# Patient Record
Sex: Male | Born: 1950 | Race: White | Hispanic: No | Marital: Married | Smoking: Current every day smoker
Health system: Southern US, Community
[De-identification: ages and names within clinical notes are randomized; demographics above are authoritative.]

## PROBLEM LIST (undated history)

## (undated) DIAGNOSIS — M1990 Unspecified osteoarthritis, unspecified site: Secondary | ICD-10-CM

## (undated) DIAGNOSIS — R0600 Dyspnea, unspecified: Secondary | ICD-10-CM

## (undated) DIAGNOSIS — H269 Unspecified cataract: Secondary | ICD-10-CM

## (undated) DIAGNOSIS — F329 Major depressive disorder, single episode, unspecified: Secondary | ICD-10-CM

## (undated) DIAGNOSIS — F32A Depression, unspecified: Secondary | ICD-10-CM

## (undated) DIAGNOSIS — H9190 Unspecified hearing loss, unspecified ear: Secondary | ICD-10-CM

## (undated) DIAGNOSIS — K219 Gastro-esophageal reflux disease without esophagitis: Secondary | ICD-10-CM

## (undated) DIAGNOSIS — F419 Anxiety disorder, unspecified: Secondary | ICD-10-CM

## (undated) DIAGNOSIS — I10 Essential (primary) hypertension: Secondary | ICD-10-CM

## (undated) HISTORY — PX: FRACTURE SURGERY: SHX138

## (undated) HISTORY — PX: JOINT REPLACEMENT: SHX530

## (undated) HISTORY — PX: PLANTAR FASCIA RELEASE: SHX2239

## (undated) HISTORY — PX: COLONOSCOPY: SHX174

## (undated) HISTORY — PX: NASAL SEPTUM SURGERY: SHX37

## (undated) HISTORY — PX: WISDOM TOOTH EXTRACTION: SHX21

## (undated) HISTORY — PX: FOOT SURGERY: SHX648

## (undated) HISTORY — PX: TONSILLECTOMY: SUR1361

---
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
## 2011-12-11 ENCOUNTER — Ambulatory Visit: Payer: Self-pay | Admitting: General Practice

## 2011-12-11 LAB — URINALYSIS, COMPLETE
Bacteria: NONE SEEN
Bilirubin,UR: NEGATIVE
Glucose,UR: NEGATIVE mg/dL (ref 0–75)
Ketone: NEGATIVE
Leukocyte Esterase: NEGATIVE
Ph: 6 (ref 4.5–8.0)
Protein: NEGATIVE
RBC,UR: <1 /HPF (ref 0–5)
WBC UR: NONE SEEN /HPF (ref 0–5)

## 2011-12-11 LAB — BASIC METABOLIC PANEL
Anion Gap: 8 (ref 7–16)
BUN: 9 mg/dL (ref 7–18)
Chloride: 109 mmol/L — ABNORMAL HIGH (ref 98–107)
Co2: 25 mmol/L (ref 21–32)
Creatinine: 0.78 mg/dL (ref 0.60–1.30)
EGFR (African American): >60
EGFR (Non-African Amer.): >60
Glucose: 112 mg/dL — ABNORMAL HIGH (ref 65–99)

## 2011-12-11 LAB — CBC
HCT: 47.2 % (ref 40.0–52.0)
HGB: 16.3 g/dL (ref 13.0–18.0)
MCH: 33.9 pg (ref 26.0–34.0)
MCHC: 34.6 g/dL (ref 32.0–36.0)
MCV: 98 fL (ref 80–100)
RBC: 4.82 10*6/uL (ref 4.40–5.90)
RDW: 14.1 % (ref 11.5–14.5)

## 2011-12-11 LAB — APTT: Activated PTT: 37.6 secs — ABNORMAL HIGH (ref 23.6–35.9)

## 2011-12-25 ENCOUNTER — Inpatient Hospital Stay: Payer: Self-pay | Admitting: General Practice

## 2011-12-26 LAB — BASIC METABOLIC PANEL
BUN: 6 mg/dL — ABNORMAL LOW (ref 7–18)
Calcium, Total: 8 mg/dL — ABNORMAL LOW (ref 8.5–10.1)
Co2: 24 mmol/L (ref 21–32)
Creatinine: 0.64 mg/dL (ref 0.60–1.30)
EGFR (Non-African Amer.): >60
Glucose: 110 mg/dL — ABNORMAL HIGH (ref 65–99)
Potassium: 3.7 mmol/L (ref 3.5–5.1)
Sodium: 138 mmol/L (ref 136–145)

## 2011-12-26 LAB — PLATELET COUNT: Platelet: 213 10*3/uL (ref 150–440)

## 2011-12-26 LAB — HEMOGLOBIN: HGB: 13.4 g/dL (ref 13.0–18.0)

## 2011-12-27 LAB — BASIC METABOLIC PANEL
BUN: 6 mg/dL — ABNORMAL LOW (ref 7–18)
Creatinine: 0.62 mg/dL (ref 0.60–1.30)

## 2012-01-01 ENCOUNTER — Ambulatory Visit: Payer: Self-pay | Admitting: General Practice

## 2013-05-31 ENCOUNTER — Emergency Department: Payer: Self-pay | Admitting: Emergency Medicine

## 2014-06-02 NOTE — Op Note (Signed)
PATIENT NAME:  Jon Flores, TEWS MR#:  [id number] DATE OF BIRTH:  16-Apr-1950  DATE OF PROCEDURE:  12/25/2011  PREOPERATIVE DIAGNOSIS: Degenerative arthrosis of the left knee.   POSTOPERATIVE DIAGNOSIS: Degenerative arthrosis of the left knee.   PROCEDURE PERFORMED: Left total knee arthroplasty using computer-assisted navigation.   SURGEON: Illene Labrador. Hooten, M.D.   ASSISTANT: Van Clines, PA-C (required to maintain retraction throughout the procedure)   ANESTHESIA: Femoral nerve block and spinal.   ESTIMATED BLOOD LOSS: 200 mL.   FLUIDS REPLACED: 1800 mL of crystalloid.   TOURNIQUET TIME: 94 minutes.   DRAINS: Two medium drains to reinfusion system.   SOFT TISSUE RELEASES: Anterior cruciate ligament, posterior cruciate ligament, deep medial collateral ligament, patellofemoral ligament.  IMPLANTS UTILIZED: DePuy PFC Sigma size 4N (narrow) posterior stabilized femoral component (cemented), size 3 MBT tibial component (cemented), 38-mm three peg oval dome patella (cemented), and a 10-mm stabilized rotating platform polyethylene insert.   INDICATIONS FOR SURGERY: The patient is a 64 year old male who has been seen for complaints of progressive left knee pain. X-rays demonstrated severe degenerative changes with varus deformity noted. He did not see any significant improvement despite conservative nonsurgical intervention. After discussion of the risks and benefits of surgical intervention, the patient expressed his understanding of the risks and benefits and agreed with plans for surgical intervention.   PROCEDURE IN DETAIL: The patient was brought into the operating room and, after adequate femoral nerve block and spinal anesthesia was achieved, a tourniquet was placed on the patient's upper left thigh. The patient's left knee and leg were cleaned and prepped with alcohol and DuraPrep and draped in the usual sterile fashion. A "time-out" was performed as per usual protocol. The left lower extremity  was exsanguinated using an Esmarch and the tourniquet was inflated to 300 mmHg. An anterior longitudinal incision was made followed by a standard mid vastus approach. A small effusion was evacuated.  The deep fibers of the medial collateral ligament were elevated in a subperiosteal fashion off of the medial flare of the tibia so as to maintain a continuous soft tissue sleeve. The patella was subluxed laterally and the patellofemoral ligament was incised. Inspection of the knee demonstrated severe degenerative changes with eburnated bone noted to the medial compartment. Prominent osteophytes were debrided using a rongeur. Anterior and posterior cruciate ligaments were excised. Two 4-mm Schanz pins were inserted into the femur and into the tibia for attachment of the array of trackers used for computer-assisted navigation. Hip center was identified using circumduction technique. Distal landmarks were mapped using the computer. Distal femur and proximal tibia were mapped using the computer. Distal femoral cutting guide was positioned using computer-assisted navigation so as to achieve a 5-degrees distal valgus cut. Cut was performed and verified using the computer. The distal femur was sized and it was felt that a size 4n (narrow) femoral component was appropriate. A size four cutting guide was positioned and the anterior cut was performed and verified using the computer. This was followed by completion of the posterior and chamfer cuts. Femoral cutting guide for the central box was then positioned and the central box cut was performed.   Attention was then directed to the proximal tibia. Medial and lateral menisci were excised. The extramedullary tibial cutting guide was positioned using computer-assisted navigation so as to achieve 0-degree varus valgus alignment and 0-degree posterior slope. The cut was performed and verified using the computer. The proximal tibia was sized and it was felt that a size three tray  was appropriate. Tibial and femoral trials were inserted followed by insertion of a 10-mm polyethylene insert. The knee was felt to be tight and was unable to achieve full extension. Trial  components were removed and the extramedullary tibial cutting guide was positioned so as to resect an additional 2 mm of bone from the proximal tibia. The cut was performed and verified using the computer. The trial components were reinserted and the 10-mm polyethylene trial was inserted. Full extension was appreciated. Excellent mediolateral and soft tissue balancing was appreciated both in extension and in flexion. Finally, the patella was cut and prepared so as to accommodate a 38-mm three peg oval dome patella. Patellar trial was placed and the knee was placed through a range of motion with excellent patellar tracking appreciated.   The trial femoral component was removed. The central post hole for the tibial component was reamed followed by insertion of a keel punch. The tibial tray was then removed. The cut surfaces of bone were irrigated with copious amounts of normal saline with antibiotic solution using pulsatile lavage and then suctioned dry. Polymethyl methacrylate cement was prepared in the usual fashion using a vacuum mixer. Cement was applied to the cut surface of the proximal tibia as well as along the undersurface of a size three MBT tibial component. The tibial component was positioned and impacted into place. Excess cement was removed using Personal assistantreer elevators. Cement was then applied to the cut surface of the femur as well as along the posterior flanges of size 4n (narrow) posterior stabilized femoral component. The femoral component was positioned and impacted into place. Excess cement was removed using Personal assistantreer elevators. A 10-mm polyethylene trial was inserted and the knee was brought into full extension with steady axial compression applied. Finally, cement was applied to the backside of a 38-mm three peg oval  dome patella. The patellar component was positioned and patellar clamp applied. Excess cement was removed using Personal assistantreer elevators.   After adequate curing of cement, the tourniquet was deflated after a total tourniquet time of 94 minutes. Hemostasis was achieved using electrocautery. The knee was irrigated with copious amounts of normal saline with antibiotic solution using pulsatile lavage and then suctioned dry. The knee was then inspected for any residual cement debris. 30 mL of 0.25% Marcaine with epinephrine was injected along the posterior capsule. A 10-mm stabilized rotating platform polyethylene insert was inserted and the knee was placed through a range of motion. Excellent patellar tracking was appreciated. Excellent mediolateral and soft tissue balancing was noted.   Two medium drains were placed in the wound bed brought out through a separate stab incision to be attached to a reinfusion system. The medial parapatellar portion of the incision was reapproximated using interrupted sutures of #1 Vicryl. The subcutaneous tissue was approximated in layers using first #0 Vicryl followed by #2-0 Vicryl. Skin was closed with skin staples. A sterile dressing was applied.   The patient tolerated the procedure well. He was transported to the recovery room in stable condition.  ____________________________ Illene LabradorJames P. Angie FavaHooten Jr., MD [id number] D: 12/25/2011 11:12:36 ET T: 12/25/2011 11:35:57 ET JOB#: [id number]  cc: Fayrene FearingJames P. Angie FavaHooten Jr., MD, <Dictator> Illene LabradorJAMES P Angie FavaHOOTEN JR MD ELECTRONICALLY SIGNED 12/27/2011 4:43
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
## 2015-12-21 ENCOUNTER — Other Ambulatory Visit: Payer: Self-pay | Admitting: Physician Assistant

## 2015-12-21 DIAGNOSIS — M546 Pain in thoracic spine: Secondary | ICD-10-CM

## 2015-12-31 ENCOUNTER — Ambulatory Visit
Admission: RE | Admit: 2015-12-31 | Discharge: 2015-12-31 | Disposition: A | Discharge status: Home / Self Care | Payer: Medicare Other | Source: Ambulatory Visit | Attending: Physician Assistant | Admitting: Physician Assistant

## 2015-12-31 DIAGNOSIS — M546 Pain in thoracic spine: Secondary | ICD-10-CM | POA: Insufficient documentation

## 2018-04-16 ENCOUNTER — Other Ambulatory Visit: Payer: Self-pay | Admitting: Physician Assistant

## 2018-04-16 DIAGNOSIS — M79641 Pain in right hand: Secondary | ICD-10-CM

## 2018-04-16 DIAGNOSIS — M546 Pain in thoracic spine: Secondary | ICD-10-CM

## 2018-04-16 DIAGNOSIS — M47812 Spondylosis without myelopathy or radiculopathy, cervical region: Secondary | ICD-10-CM

## 2018-04-25 ENCOUNTER — Other Ambulatory Visit: Payer: Self-pay

## 2018-04-25 ENCOUNTER — Ambulatory Visit
Admission: RE | Admit: 2018-04-25 | Discharge: 2018-04-25 | Disposition: A | Discharge status: Home / Self Care | Payer: Medicare Other | Source: Ambulatory Visit | Attending: Physician Assistant | Admitting: Physician Assistant

## 2018-04-25 DIAGNOSIS — M79641 Pain in right hand: Secondary | ICD-10-CM | POA: Diagnosis not present

## 2018-04-25 DIAGNOSIS — M47812 Spondylosis without myelopathy or radiculopathy, cervical region: Secondary | ICD-10-CM

## 2018-04-25 DIAGNOSIS — M546 Pain in thoracic spine: Secondary | ICD-10-CM | POA: Diagnosis present

## 2019-04-02 ENCOUNTER — Other Ambulatory Visit: Payer: Self-pay

## 2019-04-02 ENCOUNTER — Emergency Department
Admission: EM | Admit: 2019-04-02 | Discharge: 2019-04-02 | Disposition: A | Discharge status: Home / Self Care | Payer: Medicare PPO | Attending: Emergency Medicine | Admitting: Emergency Medicine

## 2019-04-02 ENCOUNTER — Other Ambulatory Visit: Payer: Self-pay | Admitting: Orthopedic Surgery

## 2019-04-02 ENCOUNTER — Emergency Department: Payer: Medicare PPO

## 2019-04-02 DIAGNOSIS — S61215A Laceration without foreign body of left ring finger without damage to nail, initial encounter: Secondary | ICD-10-CM | POA: Diagnosis not present

## 2019-04-02 DIAGNOSIS — S62617B Displaced fracture of proximal phalanx of left little finger, initial encounter for open fracture: Secondary | ICD-10-CM | POA: Diagnosis not present

## 2019-04-02 DIAGNOSIS — Y9389 Activity, other specified: Secondary | ICD-10-CM | POA: Diagnosis not present

## 2019-04-02 DIAGNOSIS — Y999 Unspecified external cause status: Secondary | ICD-10-CM | POA: Diagnosis not present

## 2019-04-02 DIAGNOSIS — Z23 Encounter for immunization: Secondary | ICD-10-CM | POA: Insufficient documentation

## 2019-04-02 DIAGNOSIS — F17210 Nicotine dependence, cigarettes, uncomplicated: Secondary | ICD-10-CM | POA: Insufficient documentation

## 2019-04-02 DIAGNOSIS — I10 Essential (primary) hypertension: Secondary | ICD-10-CM | POA: Diagnosis not present

## 2019-04-02 DIAGNOSIS — W3400XA Accidental discharge from unspecified firearms or gun, initial encounter: Secondary | ICD-10-CM | POA: Insufficient documentation

## 2019-04-02 DIAGNOSIS — Y929 Unspecified place or not applicable: Secondary | ICD-10-CM | POA: Insufficient documentation

## 2019-04-02 HISTORY — DX: Essential (primary) hypertension: I10

## 2019-04-02 MED ORDER — TETANUS-DIPHTH-ACELL PERTUSSIS 5-2.5-18.5 LF-MCG/0.5 IM SUSP
0.5000 mL | Freq: Once | INTRAMUSCULAR | Status: AC
  Administered 2019-04-02: 0.5 mL via INTRAMUSCULAR
  Filled 2019-04-02: qty 0.5

## 2019-04-02 MED ORDER — CEPHALEXIN 500 MG PO CAPS: 500.0000 mg | ORAL_CAPSULE | Freq: Four times a day (QID) | ORAL | 0 refills | Status: AC

## 2019-04-02 MED ORDER — BUPIVACAINE HCL (PF) 0.5 % IJ SOLN
30.0000 mL | Freq: Once | INTRAMUSCULAR | Status: AC
  Administered 2019-04-02: 30 mL
  Filled 2019-04-02: qty 30

## 2019-04-02 MED ORDER — CEFAZOLIN SODIUM-DEXTROSE 1-4 GM/50ML-% IV SOLN
1.0000 g | Freq: Once | INTRAVENOUS | Status: AC
  Administered 2019-04-02: 1 g via INTRAVENOUS
  Filled 2019-04-02: qty 50

## 2019-04-02 MED ORDER — MORPHINE SULFATE (PF) 4 MG/ML IV SOLN
4.0000 mg | Freq: Once | INTRAVENOUS | Status: AC
  Administered 2019-04-02: 4 mg via INTRAVENOUS
  Filled 2019-04-02: qty 1
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
## 2019-04-02 NOTE — Discharge Instructions (Addendum)
Please follow up with the hand specialist tomorrow. Please seek medical attention for any high fevers, chest pain, shortness of breath, change in behavior, persistent vomiting, bloody stool or any other new or concerning symptoms.

## 2019-04-03 ENCOUNTER — Ambulatory Visit (HOSPITAL_COMMUNITY): Payer: Medicare PPO | Admitting: Certified Registered"

## 2019-04-03 ENCOUNTER — Other Ambulatory Visit: Payer: Self-pay

## 2019-04-03 ENCOUNTER — Encounter (HOSPITAL_COMMUNITY): Payer: Self-pay | Admitting: Orthopedic Surgery

## 2019-04-03 ENCOUNTER — Ambulatory Visit (HOSPITAL_COMMUNITY)
Admission: RE | Admit: 2019-04-03 | Discharge: 2019-04-03 | Disposition: A | Discharge status: Home / Self Care | Payer: Medicare PPO | Source: Ambulatory Visit | Attending: Orthopedic Surgery | Admitting: Orthopedic Surgery

## 2019-04-03 ENCOUNTER — Encounter (HOSPITAL_COMMUNITY)
Admission: RE | Disposition: A | Discharge status: Home / Self Care | Payer: Self-pay | Source: Ambulatory Visit | Attending: Orthopedic Surgery

## 2019-04-03 DIAGNOSIS — Z20822 Contact with and (suspected) exposure to covid-19: Secondary | ICD-10-CM | POA: Diagnosis not present

## 2019-04-03 DIAGNOSIS — F329 Major depressive disorder, single episode, unspecified: Secondary | ICD-10-CM | POA: Diagnosis not present

## 2019-04-03 DIAGNOSIS — Z6830 Body mass index (BMI) 30.0-30.9, adult: Secondary | ICD-10-CM | POA: Insufficient documentation

## 2019-04-03 DIAGNOSIS — Z79899 Other long term (current) drug therapy: Secondary | ICD-10-CM | POA: Diagnosis not present

## 2019-04-03 DIAGNOSIS — F419 Anxiety disorder, unspecified: Secondary | ICD-10-CM | POA: Diagnosis not present

## 2019-04-03 DIAGNOSIS — R0602 Shortness of breath: Secondary | ICD-10-CM | POA: Diagnosis not present

## 2019-04-03 DIAGNOSIS — E669 Obesity, unspecified: Secondary | ICD-10-CM | POA: Insufficient documentation

## 2019-04-03 DIAGNOSIS — F17210 Nicotine dependence, cigarettes, uncomplicated: Secondary | ICD-10-CM | POA: Insufficient documentation

## 2019-04-03 DIAGNOSIS — S62617B Displaced fracture of proximal phalanx of left little finger, initial encounter for open fracture: Secondary | ICD-10-CM | POA: Insufficient documentation

## 2019-04-03 DIAGNOSIS — I10 Essential (primary) hypertension: Secondary | ICD-10-CM | POA: Insufficient documentation

## 2019-04-03 DIAGNOSIS — M1990 Unspecified osteoarthritis, unspecified site: Secondary | ICD-10-CM | POA: Diagnosis not present

## 2019-04-03 DIAGNOSIS — K219 Gastro-esophageal reflux disease without esophagitis: Secondary | ICD-10-CM | POA: Diagnosis not present

## 2019-04-03 DIAGNOSIS — S61432A Puncture wound without foreign body of left hand, initial encounter: Secondary | ICD-10-CM | POA: Diagnosis present

## 2019-04-03 DIAGNOSIS — S62615B Displaced fracture of proximal phalanx of left ring finger, initial encounter for open fracture: Secondary | ICD-10-CM | POA: Diagnosis not present

## 2019-04-03 DIAGNOSIS — S66327A Laceration of extensor muscle, fascia and tendon of left little finger at wrist and hand level, initial encounter: Secondary | ICD-10-CM | POA: Insufficient documentation

## 2019-04-03 DIAGNOSIS — Y9389 Activity, other specified: Secondary | ICD-10-CM | POA: Diagnosis not present

## 2019-04-03 DIAGNOSIS — W3400XA Accidental discharge from unspecified firearms or gun, initial encounter: Secondary | ICD-10-CM | POA: Insufficient documentation

## 2019-04-03 HISTORY — DX: Unspecified hearing loss, unspecified ear: H91.90

## 2019-04-03 HISTORY — DX: Depression, unspecified: F32.A

## 2019-04-03 HISTORY — DX: Unspecified cataract: H26.9

## 2019-04-03 HISTORY — DX: Unspecified osteoarthritis, unspecified site: M19.90

## 2019-04-03 HISTORY — DX: Gastro-esophageal reflux disease without esophagitis: K21.9

## 2019-04-03 HISTORY — DX: Dyspnea, unspecified: R06.00

## 2019-04-03 HISTORY — DX: Anxiety disorder, unspecified: F41.9

## 2019-04-03 HISTORY — PX: I & D EXTREMITY: SHX5045

## 2019-04-03 LAB — CBC
HCT: 46.2 % (ref 39.0–52.0)
Hemoglobin: 15 g/dL (ref 13.0–17.0)
MCH: 32.9 pg (ref 26.0–34.0)
MCHC: 32.5 g/dL (ref 30.0–36.0)
MCV: 101.3 fL — ABNORMAL HIGH (ref 80.0–100.0)
Platelets: 287 10*3/uL (ref 150–400)
RBC: 4.56 MIL/uL (ref 4.22–5.81)
RDW: 13.2 % (ref 11.5–15.5)
WBC: 8.8 10*3/uL (ref 4.0–10.5)
nRBC: 0 % (ref 0.0–0.2)

## 2019-04-03 LAB — BASIC METABOLIC PANEL
Anion gap: 10 (ref 5–15)
BUN: 10 mg/dL (ref 8–23)
CO2: 24 mmol/L (ref 22–32)
Calcium: 9.1 mg/dL (ref 8.9–10.3)
Chloride: 102 mmol/L (ref 98–111)
Creatinine, Ser: 0.79 mg/dL (ref 0.61–1.24)
GFR calc Af Amer: >60 mL/min (ref >60)
GFR calc non Af Amer: >60 mL/min (ref >60)
Glucose, Bld: 147 mg/dL — ABNORMAL HIGH (ref 70–99)
Potassium: 4.1 mmol/L (ref 3.5–5.1)
Sodium: 136 mmol/L (ref 135–145)

## 2019-04-03 LAB — RESPIRATORY PANEL BY RT PCR (FLU A&B, COVID)
Influenza A by PCR: NEGATIVE
Influenza B by PCR: NEGATIVE
SARS Coronavirus 2 by RT PCR: NEGATIVE

## 2019-04-03 SURGERY — IRRIGATION AND DEBRIDEMENT EXTREMITY
Anesthesia: General | Site: Finger | Laterality: Left

## 2019-04-03 MED ORDER — DEXAMETHASONE SODIUM PHOSPHATE 10 MG/ML IJ SOLN
INTRAMUSCULAR | Status: DC | PRN
  Administered 2019-04-03: 5 mg via INTRAVENOUS

## 2019-04-03 MED ORDER — PROMETHAZINE HCL 25 MG/ML IJ SOLN: 6.2500 mg | INTRAMUSCULAR | Status: DC | PRN

## 2019-04-03 MED ORDER — CEFAZOLIN SODIUM-DEXTROSE 2-3 GM-%(50ML) IV SOLR
INTRAVENOUS | Status: DC | PRN
  Administered 2019-04-03: 2 g via INTRAVENOUS

## 2019-04-03 MED ORDER — SODIUM CHLORIDE 0.9 % IR SOLN
Status: DC | PRN
  Administered 2019-04-03: 1000 mL

## 2019-04-03 MED ORDER — OXYCODONE HCL 5 MG/5ML PO SOLN: 5.0000 mg | Freq: Once | ORAL | Status: AC | PRN

## 2019-04-03 MED ORDER — DEXAMETHASONE SODIUM PHOSPHATE 10 MG/ML IJ SOLN
INTRAMUSCULAR | Status: AC
  Filled 2019-04-03: qty 1

## 2019-04-03 MED ORDER — OXYCODONE HCL 5 MG PO TABS
5.0000 mg | ORAL_TABLET | Freq: Once | ORAL | Status: AC | PRN
  Administered 2019-04-03: 5 mg via ORAL

## 2019-04-03 MED ORDER — OXYCODONE-ACETAMINOPHEN 10-325 MG PO TABS: 1.0000 | ORAL_TABLET | Freq: Four times a day (QID) | ORAL | 0 refills | Status: AC | PRN

## 2019-04-03 MED ORDER — FENTANYL CITRATE (PF) 250 MCG/5ML IJ SOLN
INTRAMUSCULAR | Status: AC
  Filled 2019-04-03: qty 5

## 2019-04-03 MED ORDER — HYDROMORPHONE HCL 1 MG/ML IJ SOLN
0.2500 mg | INTRAMUSCULAR | Status: DC | PRN
  Administered 2019-04-03 (×2): 0.25 mg via INTRAVENOUS
  Administered 2019-04-03: 0.5 mg via INTRAVENOUS

## 2019-04-03 MED ORDER — FENTANYL CITRATE (PF) 250 MCG/5ML IJ SOLN
INTRAMUSCULAR | Status: DC | PRN
  Administered 2019-04-03: 50 ug via INTRAVENOUS
  Administered 2019-04-03 (×2): 100 ug via INTRAVENOUS

## 2019-04-03 MED ORDER — BUPIVACAINE HCL (PF) 0.25 % IJ SOLN
INTRAMUSCULAR | Status: AC
  Filled 2019-04-03: qty 30

## 2019-04-03 MED ORDER — PROPOFOL 10 MG/ML IV BOLUS
INTRAVENOUS | Status: DC | PRN
  Administered 2019-04-03: 170 mg via INTRAVENOUS

## 2019-04-03 MED ORDER — MEPERIDINE HCL 25 MG/ML IJ SOLN: 6.2500 mg | INTRAMUSCULAR | Status: DC | PRN

## 2019-04-03 MED ORDER — LACTATED RINGERS IV SOLN: INTRAVENOUS | Status: DC

## 2019-04-03 MED ORDER — OXYCODONE HCL 5 MG PO TABS
ORAL_TABLET | ORAL | Status: AC
  Filled 2019-04-03: qty 1

## 2019-04-03 MED ORDER — PROPOFOL 10 MG/ML IV BOLUS
INTRAVENOUS | Status: AC
  Filled 2019-04-03: qty 20

## 2019-04-03 MED ORDER — LIDOCAINE HCL (PF) 1 % IJ SOLN
INTRAMUSCULAR | Status: DC | PRN
  Administered 2019-04-03: 10 mL

## 2019-04-03 MED ORDER — CEFAZOLIN SODIUM-DEXTROSE 2-4 GM/100ML-% IV SOLN
INTRAVENOUS | Status: AC
  Filled 2019-04-03: qty 100

## 2019-04-03 MED ORDER — HYDROMORPHONE HCL 1 MG/ML IJ SOLN
INTRAMUSCULAR | Status: AC
  Filled 2019-04-03: qty 1

## 2019-04-03 MED ORDER — MIDAZOLAM HCL 2 MG/2ML IJ SOLN
INTRAMUSCULAR | Status: DC | PRN
  Administered 2019-04-03: 2 mg via INTRAVENOUS

## 2019-04-03 MED ORDER — LIDOCAINE HCL (PF) 1 % IJ SOLN
INTRAMUSCULAR | Status: AC
  Filled 2019-04-03: qty 30

## 2019-04-03 MED ORDER — ONDANSETRON HCL 4 MG/2ML IJ SOLN
INTRAMUSCULAR | Status: DC | PRN
  Administered 2019-04-03: 4 mg via INTRAVENOUS

## 2019-04-03 MED ORDER — MIDAZOLAM HCL 2 MG/2ML IJ SOLN
INTRAMUSCULAR | Status: AC
  Filled 2019-04-03: qty 2

## 2019-04-03 MED ORDER — LIDOCAINE 2% (20 MG/ML) 5 ML SYRINGE
INTRAMUSCULAR | Status: DC | PRN
  Administered 2019-04-03: 60 mg via INTRAVENOUS

## 2019-04-03 SURGICAL SUPPLY — 53 items
BNDG ELASTIC 3X5.8 VLCR STR LF (GAUZE/BANDAGES/DRESSINGS) ×3 IMPLANT
BNDG ELASTIC 4X5.8 VLCR STR LF (GAUZE/BANDAGES/DRESSINGS) ×3 IMPLANT
BNDG ESMARK 4X9 LF (GAUZE/BANDAGES/DRESSINGS) ×3 IMPLANT
BNDG GAUZE ELAST 4 BULKY (GAUZE/BANDAGES/DRESSINGS) ×3 IMPLANT
CORD BIPOLAR FORCEPS 12FT (ELECTRODE) ×3 IMPLANT
COVER SURGICAL LIGHT HANDLE (MISCELLANEOUS) ×3 IMPLANT
COVER WAND RF STERILE (DRAPES) ×3 IMPLANT
CUFF TOURN SGL QUICK 18X4 (TOURNIQUET CUFF) ×3 IMPLANT
CUFF TOURN SGL QUICK 24 (TOURNIQUET CUFF)
CUFF TRNQT CYL 24X4X16.5-23 (TOURNIQUET CUFF) IMPLANT
DRAIN PENROSE 1/4X12 LTX STRL (WOUND CARE) IMPLANT
DRAPE SURG 17X23 STRL (DRAPES) ×3 IMPLANT
DRSG ADAPTIC 3X8 NADH LF (GAUZE/BANDAGES/DRESSINGS) ×3 IMPLANT
ELECT REM PT RETURN 9FT ADLT (ELECTROSURGICAL)
ELECTRODE REM PT RTRN 9FT ADLT (ELECTROSURGICAL) IMPLANT
GAUZE SPONGE 4X4 12PLY STRL (GAUZE/BANDAGES/DRESSINGS) ×3 IMPLANT
GLOVE BIOGEL PI IND STRL 8.5 (GLOVE) ×1 IMPLANT
GLOVE BIOGEL PI INDICATOR 8.5 (GLOVE) ×2
GLOVE SURG ORTHO 8.0 STRL STRW (GLOVE) ×3 IMPLANT
GOWN STRL REUS W/ TWL LRG LVL3 (GOWN DISPOSABLE) ×3 IMPLANT
GOWN STRL REUS W/ TWL XL LVL3 (GOWN DISPOSABLE) ×1 IMPLANT
GOWN STRL REUS W/TWL LRG LVL3 (GOWN DISPOSABLE) ×6
GOWN STRL REUS W/TWL XL LVL3 (GOWN DISPOSABLE) ×2
HANDPIECE INTERPULSE COAX TIP (DISPOSABLE)
K-WIRE .045X4 (WIRE) ×3 IMPLANT
KIT BASIN OR (CUSTOM PROCEDURE TRAY) ×3 IMPLANT
KIT TURNOVER KIT B (KITS) ×3 IMPLANT
MANIFOLD NEPTUNE II (INSTRUMENTS) ×3 IMPLANT
NEEDLE HYPO 25GX1X1/2 BEV (NEEDLE) ×3 IMPLANT
NS IRRIG 1000ML POUR BTL (IV SOLUTION) ×3 IMPLANT
PACK ORTHO EXTREMITY (CUSTOM PROCEDURE TRAY) ×3 IMPLANT
PAD CAST 4YDX4 CTTN HI CHSV (CAST SUPPLIES) ×1 IMPLANT
PADDING CAST COTTON 4X4 STRL (CAST SUPPLIES) ×2
SET CYSTO W/LG BORE CLAMP LF (SET/KITS/TRAYS/PACK) IMPLANT
SET HNDPC FAN SPRY TIP SCT (DISPOSABLE) IMPLANT
SOAP 2 % CHG 4 OZ (WOUND CARE) ×3 IMPLANT
SPONGE LAP 18X18 RF (DISPOSABLE) ×3 IMPLANT
SPONGE LAP 4X18 RFD (DISPOSABLE) ×3 IMPLANT
SUT ETHIBOND 4 0 TF (SUTURE) ×3 IMPLANT
SUT ETHILON 4 0 PS 2 18 (SUTURE) IMPLANT
SUT ETHILON 5 0 P 3 18 (SUTURE)
SUT NYLON ETHILON 5-0 P-3 1X18 (SUTURE) IMPLANT
SUT PROLENE 4 0 PS 2 18 (SUTURE) ×3 IMPLANT
SWAB COLLECTION DEVICE MRSA (MISCELLANEOUS) ×3 IMPLANT
SWAB CULTURE ESWAB REG 1ML (MISCELLANEOUS) IMPLANT
SYR CONTROL 10ML LL (SYRINGE) ×3 IMPLANT
TOWEL GREEN STERILE (TOWEL DISPOSABLE) ×3 IMPLANT
TOWEL GREEN STERILE FF (TOWEL DISPOSABLE) ×3 IMPLANT
TUBE CONNECTING 12'X1/4 (SUCTIONS) ×1
TUBE CONNECTING 12X1/4 (SUCTIONS) ×2 IMPLANT
UNDERPAD 30X30 (UNDERPADS AND DIAPERS) ×3 IMPLANT
WATER STERILE IRR 1000ML POUR (IV SOLUTION) ×3 IMPLANT
YANKAUER SUCT BULB TIP NO VENT (SUCTIONS) ×3 IMPLANT

## 2019-04-03 NOTE — Discharge Instructions (Signed)
KEEP BANDAGE CLEAN AND DRY °CALL OFFICE FOR F/U APPT [phone] in 7 days °KEEP HAND ELEVATED ABOVE HEART °OK TO APPLY ICE TO OPERATIVE AREA °CONTACT OFFICE IF ANY WORSENING PAIN OR CONCERNS. °

## 2019-04-03 NOTE — Transfer of Care (Signed)
Immediate Anesthesia Transfer of Care Note  Patient: Jon Flores  Procedure(s) Performed: IRRIGATION AND DEBRIDEMENT LEFT SMALL FINGER AND REPAIR IF NECESSARY (Left )  Patient Location: PACU  Anesthesia Type:General  Level of Consciousness: awake, alert  and oriented  Airway & Oxygen Therapy: Patient Spontanous Breathing  Post-op Assessment: Report given to RN  Post vital signs: Reviewed and stable  Last Vitals:  Vitals Value Taken Time  BP 168/90 04/03/19 1732  Temp    Pulse 93 04/03/19 1733  Resp 18 04/03/19 1733  SpO2 97 % 04/03/19 1733  Vitals shown include unvalidated device data.  Last Pain:  Vitals:   04/03/19 1133  PainSc: 5       Patients Stated Pain Goal: 2 (04/03/19 1133)  Complications: No apparent anesthesia complications
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
## 2019-04-03 NOTE — Anesthesia Preprocedure Evaluation (Addendum)
Anesthesia Evaluation  Patient identified by MRN, date of birth, ID band Patient awake    Reviewed: Allergy & Precautions, NPO status , Patient's Chart, lab work & pertinent test results  Airway Mallampati: II  TM Distance: >3 FB Neck ROM: Full    Dental  (+) Dental Advisory Given   Pulmonary shortness of breath, Current Smoker,    Pulmonary exam normal breath sounds clear to auscultation       Cardiovascular hypertension, negative cardio ROS Normal cardiovascular exam Rhythm:Regular Rate:Normal     Neuro/Psych PSYCHIATRIC DISORDERS Anxiety Depression negative neurological ROS     GI/Hepatic Neg liver ROS, GERD  ,  Endo/Other  negative endocrine ROS  Renal/GU negative Renal ROS     Musculoskeletal  (+) Arthritis ,   Abdominal (+) + obese,   Peds  Hematology negative hematology ROS (+)   Anesthesia Other Findings   Reproductive/Obstetrics                             Anesthesia Physical Anesthesia Plan  ASA: II  Anesthesia Plan: General   Post-op Pain Management:    Induction: Intravenous and Rapid sequence  PONV Risk Score and Plan: 2  Airway Management Planned: Oral ETT  Additional Equipment: None  Intra-op Plan:   Post-operative Plan: Extubation in OR  Informed Consent: I have reviewed the patients History and Physical, chart, labs and discussed the procedure including the risks, benefits and alternatives for the proposed anesthesia with the patient or authorized representative who has indicated his/her understanding and acceptance.     Dental advisory given  Plan Discussed with: CRNA  Anesthesia Plan Comments:        Anesthesia Quick Evaluation
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
## 2019-04-03 NOTE — Anesthesia Procedure Notes (Signed)
Procedure Name: LMA Insertion Date/Time: 04/03/2019 4:27 PM Performed by: De Nurse, CRNA Pre-anesthesia Checklist: Patient identified, Emergency Drugs available, Suction available and Patient being monitored Patient Re-evaluated:Patient Re-evaluated prior to induction Oxygen Delivery Method: Circle System Utilized Preoxygenation: Pre-oxygenation with 100% oxygen Induction Type: IV induction Ventilation: Mask ventilation without difficulty LMA: LMA inserted LMA Size: 4.5 Number of attempts: 1 Placement Confirmation: positive ETCO2 Tube secured with: Tape Dental Injury: Teeth and Oropharynx as per pre-operative assessment

## 2019-04-04 NOTE — Anesthesia Postprocedure Evaluation (Signed)
Anesthesia Post Note  Patient: Jon Flores  Procedure(s) Performed: IRRIGATION AND DEBRIDEMENT and insertion of K- wire LEFT SMALL FINGer; I & D left ring ringer (Left Finger)     Patient location during evaluation: PACU Anesthesia Type: General Level of consciousness: awake and alert Pain management: pain level controlled Vital Signs Assessment: post-procedure vital signs reviewed and stable Respiratory status: spontaneous breathing, nonlabored ventilation, respiratory function stable and patient connected to nasal cannula oxygen Cardiovascular status: blood pressure returned to baseline and stable Postop Assessment: no apparent nausea or vomiting Anesthetic complications: no    Last Vitals:  Vitals:   04/03/19 1747 04/03/19 1802  BP: (!) 163/81 (!) 159/79  Pulse: 88 82  Resp: 19 15  Temp:  37 C  SpO2: 97% 96%    Last Pain:  Vitals:   04/03/19 1802  PainSc: Asleep                 Faye Strohman S
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
## 2023-05-28 ENCOUNTER — Telehealth: Payer: Self-pay

## 2023-05-28 NOTE — Telephone Encounter (Signed)
 Pt wife rhonda checking on referral status we have ref. I left vm that pt will get call back within 48 hours to schedule colonoscopy

## 2023-05-29 ENCOUNTER — Telehealth: Payer: Self-pay | Admitting: *Deleted

## 2023-05-29 ENCOUNTER — Other Ambulatory Visit: Payer: Self-pay | Admitting: *Deleted

## 2023-05-29 DIAGNOSIS — Z1211 Encounter for screening for malignant neoplasm of colon: Secondary | ICD-10-CM

## 2023-05-29 MED ORDER — NA SULFATE-K SULFATE-MG SULF 17.5-3.13-1.6 GM/177ML PO SOLN: 1.0000 | Freq: Once | ORAL | 0 refills | Status: AC

## 2023-05-29 NOTE — Telephone Encounter (Signed)
 The patient wife Sallyanne Creamer) called in to schedule her husband colonoscopy.

## 2023-05-29 NOTE — Telephone Encounter (Signed)
 Colonoscopy schedule on 06/26/2023 with Dr Ole Berkeley

## 2023-05-29 NOTE — Telephone Encounter (Signed)
 Gastroenterology Pre-Procedure Review  Request Date: 06/26/2023 Requesting Physician: Dr. Ole Berkeley  PATIENT REVIEW QUESTIONS: The patient responded to the following health history questions as indicated:    1. Are you having any GI issues? no 2. Do you have a personal history of Polyps?  Possible polyps on his last colonoscopy over 10 years ago but not sure 3. Do you have a family history of Colon Cancer or Polyps? no 4. Diabetes Mellitus? no 5. Joint replacements in the past 12 months?no 6. Major health problems in the past 3 months?no 7. Any artificial heart valves, MVP, or defibrillator?no    MEDICATIONS & ALLERGIES:    Patient reports the following regarding taking any anticoagulation/antiplatelet therapy:   Plavix, Coumadin, Eliquis, Xarelto, Lovenox, Pradaxa, Brilinta, or Effient? no Aspirin? no  Patient confirms/reports the following medications:  Current Outpatient Medications  Medication Sig Dispense Refill   Na Sulfate-K Sulfate-Mg Sulfate concentrate (SUPREP) 17.5-3.13-1.6 GM/177ML SOLN Take 1 kit (354 mLs total) by mouth once for 1 dose. 354 mL 0   amitriptyline (ELAVIL) 25 MG tablet Take 75 mg by mouth at bedtime.     amLODipine-benazepril (LOTREL) 5-10 MG capsule Take 1 capsule by mouth daily.     fluticasone (FLONASE) 50 MCG/ACT nasal spray Place 2 sprays into both nostrils daily as needed for allergies.     No current facility-administered medications for this visit.    Patient confirms/reports the following allergies:  Allergies  Allergen Reactions   Bee Venom Anaphylaxis   Duloxetine Other (See Comments)    Other reaction(s): Other (See Comments) Makes pt feel "crazy", causes black outs Makes pt feel "crazy", causes black outs Makes pt feel "crazy", causes black outs Makes pt feel "crazy", causes black outs    Amitriptyline     Other reaction(s): Other (See Comments), Other (See Comments) Blurred vision, body temperature increased " I get really hot 20 minutes  after taking it", water pockets under eyes. Blurred vision, body temperature increased " I get really hot 20 minutes after taking it", water pockets under eyes. Blurred vision, body temperature increased " I get really hot 20 minutes after taking it", water pockets under eyes. Blurred vision, body temperature increased " I get really hot 20 minutes after taking it", water pockets under eyes.     No orders of the defined types were placed in this encounter.   AUTHORIZATION INFORMATION Primary Insurance: 1D#: Group #:  Secondary Insurance: 1D#: Group #:  SCHEDULE INFORMATION: Date: 06/26/2023 Time: Location: [facility]

## 2023-05-31 NOTE — Telephone Encounter (Signed)
 Patient report was fax in.

## 2023-06-20 ENCOUNTER — Telehealth: Payer: Self-pay | Admitting: *Deleted

## 2023-06-20 NOTE — Telephone Encounter (Signed)
 Patient called office because he received a call yesterday regarding his medications. Inform patient that the call must be from [facility] and they must wanted to get over things before his schedule colonoscopy on 06/26/2023.  Patient verbalized understanding.

## 2023-06-25 ENCOUNTER — Encounter: Payer: Self-pay | Admitting: Gastroenterology

## 2023-06-26 ENCOUNTER — Ambulatory Visit
Admission: RE | Admit: 2023-06-26 | Discharge: 2023-06-26 | Disposition: A | Discharge status: Home / Self Care | Attending: Gastroenterology | Admitting: Gastroenterology

## 2023-06-26 ENCOUNTER — Encounter: Payer: Self-pay | Admitting: Gastroenterology

## 2023-06-26 ENCOUNTER — Encounter
Admission: RE | Disposition: A | Discharge status: Home / Self Care | Payer: Self-pay | Source: Home / Self Care | Attending: Gastroenterology

## 2023-06-26 ENCOUNTER — Ambulatory Visit: Admitting: General Practice

## 2023-06-26 DIAGNOSIS — Z1211 Encounter for screening for malignant neoplasm of colon: Secondary | ICD-10-CM | POA: Diagnosis present

## 2023-06-26 DIAGNOSIS — D122 Benign neoplasm of ascending colon: Secondary | ICD-10-CM | POA: Diagnosis not present

## 2023-06-26 DIAGNOSIS — F32A Depression, unspecified: Secondary | ICD-10-CM | POA: Insufficient documentation

## 2023-06-26 DIAGNOSIS — D124 Benign neoplasm of descending colon: Secondary | ICD-10-CM | POA: Diagnosis not present

## 2023-06-26 DIAGNOSIS — F17210 Nicotine dependence, cigarettes, uncomplicated: Secondary | ICD-10-CM | POA: Insufficient documentation

## 2023-06-26 DIAGNOSIS — F419 Anxiety disorder, unspecified: Secondary | ICD-10-CM | POA: Diagnosis not present

## 2023-06-26 DIAGNOSIS — K635 Polyp of colon: Secondary | ICD-10-CM

## 2023-06-26 DIAGNOSIS — I10 Essential (primary) hypertension: Secondary | ICD-10-CM | POA: Insufficient documentation

## 2023-06-26 DIAGNOSIS — K641 Second degree hemorrhoids: Secondary | ICD-10-CM | POA: Diagnosis not present

## 2023-06-26 HISTORY — PX: COLONOSCOPY: SHX5424

## 2023-06-26 HISTORY — PX: POLYPECTOMY: SHX149

## 2023-06-26 SURGERY — COLONOSCOPY
Anesthesia: General

## 2023-06-26 MED ORDER — PROPOFOL 500 MG/50ML IV EMUL
INTRAVENOUS | Status: DC | PRN
  Administered 2023-06-26: 70 mg via INTRAVENOUS
  Administered 2023-06-26: 150 ug/kg/min via INTRAVENOUS
  Administered 2023-06-26: 125 ug/kg/min via INTRAVENOUS

## 2023-06-26 MED ORDER — SODIUM CHLORIDE 0.9 % IV SOLN: INTRAVENOUS | Status: DC
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
## 2023-06-26 NOTE — Anesthesia Postprocedure Evaluation (Signed)
 Anesthesia Post Note  Patient: Jon Flores  Procedure(s) Performed: COLONOSCOPY  Patient location during evaluation: Endoscopy Anesthesia Type: General Level of consciousness: awake and alert Pain management: pain level controlled Vital Signs Assessment: post-procedure vital signs reviewed and stable Respiratory status: spontaneous breathing, nonlabored ventilation, respiratory function stable and patient connected to nasal cannula oxygen Cardiovascular status: blood pressure returned to baseline and stable Postop Assessment: no apparent nausea or vomiting Anesthetic complications: no  There were no known notable events for this encounter.   Last Vitals:  Vitals:   06/26/23 0802 06/26/23 0929  BP: (!) 156/76 114/66  Pulse: 87 69  Resp: 18 20  Temp: (!) 36.3 C (!) 36.1 C  SpO2: 100% 98%    Last Pain:  Vitals:   06/26/23 0929  TempSrc: Temporal  PainSc: 0-No pain                 Enrique Harvest
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
## 2023-06-26 NOTE — Transfer of Care (Signed)
 Immediate Anesthesia Transfer of Care Note  Patient: Jon Flores  Procedure(s) Performed: COLONOSCOPY  Patient Location: Endoscopy Unit  Anesthesia Type:General  Level of Consciousness: sedated  Airway & Oxygen Therapy: Patient Spontanous Breathing  Post-op Assessment: Report given to RN and Post -op Vital signs reviewed and stable  Post vital signs: Reviewed and stable  Last Vitals:  Vitals Value Taken Time  BP    Temp    Pulse    Resp    SpO2      Last Pain:  Vitals:   06/26/23 0802  TempSrc: Temporal  PainSc: 0-No pain         Complications: There were no known notable events for this encounter.

## 2023-06-26 NOTE — Op Note (Addendum)
 [facility] Gastroenterology Patient Name: Jon Flores Procedure Date: 06/26/2023 9:07 AM MRN: [id number] Account #: [id number] Date of Birth: Oct 02, 1950 Admit Type: Outpatient Age: 73 Room:  [facility] ENDO ROOM 4 Gender: Male Note Status: Finalized Instrument Name: Charlyn Cooley [id number] Procedure:             Colonoscopy Indications:           Screening for colorectal malignant neoplasm Providers:             Marnee Sink MD, MD Referring MD:          No Local Md, MD (Referring MD) Medicines:             Propofol  per Anesthesia Complications:         No immediate complications. Procedure:             Pre-Anesthesia Assessment:                        - Prior to the procedure, a History and Physical was                         performed, and patient medications and allergies were                         reviewed. The patient's tolerance of previous                         anesthesia was also reviewed. The risks and benefits                         of the procedure and the sedation options and risks                         were discussed with the patient. All questions were                         answered, and informed consent was obtained. Prior                         Anticoagulants: The patient has taken no anticoagulant                         or antiplatelet agents. ASA Grade Assessment: II - A                         patient with mild systemic disease. After reviewing                         the risks and benefits, the patient was deemed in                         satisfactory condition to undergo the procedure.                        After obtaining informed consent, the colonoscope was                         passed under direct vision. Throughout the procedure,  the patient's blood pressure, pulse, and oxygen                         saturations were monitored continuously. The                         Colonoscope was introduced through the anus  and                         advanced to the the cecum, identified by appendiceal                         orifice and ileocecal valve. The colonoscopy was                         performed without difficulty. The patient tolerated                         the procedure well. The quality of the bowel                         preparation was good. Findings:      The perianal and digital rectal examinations were normal.      There was a thickened fold seen. Biopsies were taken with a cold forceps       for histology.      Two sessile polyps were found in the ascending colon. The polyps were 2       to 3 mm in size. These polyps were removed with a cold biopsy forceps.       Resection and retrieval were complete.      Three sessile polyps were found in the descending colon. The polyps were       3 to 6 mm in size. These polyps were removed with a cold snare.       Resection and retrieval were complete.      Non-bleeding internal hemorrhoids were found during retroflexion. The       hemorrhoids were Grade II (internal hemorrhoids that prolapse but reduce       spontaneously). Impression:            - Two 2 to 3 mm polyps in the ascending colon, removed                         with a cold biopsy forceps. Resected and retrieved.                        - Three 3 to 6 mm polyps in the descending colon,                         removed with a cold snare. Resected and retrieved.                        - Non-bleeding internal hemorrhoids. Recommendation:        - Discharge patient to home.                        - Resume previous diet.                        -  Continue present medications.                        - Await pathology results.                        - If the pathology report reveals adenomatous tissue,                         then repeat the colonoscopy for surveillance in 5                         years.                        - If cecal fold adenomatous then will need further                          treatment Procedure Code(s):     --- Professional ---                        [phone], Colonoscopy, flexible; with removal of                         tumor(s), polyp(s), or other lesion(s) by snare                         technique                        45380, 59, Colonoscopy, flexible; with biopsy, single                         or multiple Diagnosis Code(s):     --- Professional ---                        Z12.11, Encounter for screening for malignant neoplasm                         of colon                        D12.4, Benign neoplasm of descending colon CPT copyright 2022 [employer]. All rights reserved. The codes documented in this report are preliminary and upon coder review may  be revised to meet current compliance requirements. Marnee Sink MD, MD 06/26/2023 9:28:13 AM This report has been signed electronically. Number of Addenda: 0 Note Initiated On: 06/26/2023 9:07 AM Scope Withdrawal Time: 0 hours 9 minutes 20 seconds  Total Procedure Duration: 0 hours 13 minutes 51 seconds  Estimated Blood Loss:  Estimated blood loss: none.      [facility]

## 2023-06-27 LAB — SURGICAL PATHOLOGY

## 2023-06-28 ENCOUNTER — Ambulatory Visit: Payer: Self-pay | Admitting: Gastroenterology

## 2023-07-06 ENCOUNTER — Telehealth: Payer: Self-pay

## 2023-07-06 NOTE — Telephone Encounter (Signed)
 Left message on voicemail requesting pathology results from colonoscopy

## 2023-07-11 NOTE — Telephone Encounter (Signed)
 Left message on voicemail.

## 2024-01-25 NOTE — Progress Notes (Signed)
 " Subjective   CC/HPI: History of Present Illness Jon Flores is a 73 year old male who presents with bilateral hand pain and concerns about a persistent bruise.  He has experienced severe pain in both hands for the past couple of months, described as similar to 'stiff arthritis.' The pain was so intense that he was unable to grasp objects or open a water bottle. He sought care at [facility] in [place], where he was told it was probably arthritis, although no x-rays were taken. A methylprednisolone taper was prescribed, which he completed a week ago, and his hand pain has resolved since then.  He is concerned about a persistent bruise on his body that has been present for over a year. He mentions a family history of cancer, specifically his brother who had lung cancer that metastasized to the liver. He worries about the bruise being a sign of liver problems, recalling doctors mentioning this to his brother.  He has a significant smoking history, having smoked since elementary school and currently smoking a pack a day. He has not had any prior CT scans for lung cancer screening.  He is currently taking amitriptyline 75 mg at night for sleep and depression but reports it is not effective in helping him fall asleep, as he stays awake until 2 AM. He also takes Motofen for blood pressure, which he states is working well.    1. Bruising   2. Tobacco use   3. Personal history of nicotine dependence   4. 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       Anxiety    Depression    Relevant Medications   amitriptyline (ELAVIL) 100 MG tablet   BCC (basal cell carcinoma), face    BCC of the right medial canthus treated with Mohs surgery on 05/22/13.      Long term current use of opiate analgesic    Chronic nasal congestion    Urinary urgency        His medication list is as follows: Prior to Admission medications  Medication Sig Start Date End Date Taking? Authorizing Provider  amlodipine-benazepril (LOTREL) 10-20 mg per capsule Take 1 capsule by mouth daily. 06/29/23  Yes Arneta Debby Malloy, MD  oxyCODONE -acetaminophen  (PERCOCET) 10-325 mg per tablet TAKE 1 TABLET BY MOUTH THREE TIMES A DAY 01/02/17  Yes [provider]  amitriptyline (ELAVIL) 100 MG tablet Take 1 tablet (100 mg total) by mouth nightly. 01/25/24   Arneta Debby Malloy, MD  BINAXNOW COVID-19 AG SELF TEST Kit FOLLOW PACKAGE INSTRUCTIONS Patient not taking: Reported on 01/25/2024 05/11/21   [provider]  fluoride, sodium, 1.1 % Gel  01/01/20   [provider]  naloxone (NARCAN) 4 mg nasal spray 1 spray  into each nostril. Patient not taking: Reported on 01/25/2024 01/05/20   [provider]  NON FORMULARY Frequency:QD   Dosage:0.0     Instructions:  Note:Dose: ANES PAIN MANAGEMENT Patient not taking: Reported on 01/25/2024 05/16/11   [provider]  rosuvastatin (CRESTOR) 5 MG tablet Take 1 tablet (5 mg total) by mouth nightly. Patient not taking: Reported on 01/25/2024 06/29/23 06/28/24  Arneta Debby Malloy, MD  terazosin (HYTRIN) 2 MG capsule Take 1 capsule (2 mg total) by mouth daily. Patient not taking: Reported on 01/25/2024 06/29/23 06/28/24  Arneta Debby Malloy, MD    Allergies[1]  Past Surgical  History[2]  Family History  family history includes Basal cell carcinoma in his brother; Colon polyps in his brother; Diabetes in his brother and sister..   Social History His  reports that he has been smoking cigarettes and cigars. He started smoking about 68 years ago. He has a 51.5 pack-year smoking history. He has never used smokeless tobacco. He reports current alcohol use of about 1.0 standard drink of alcohol per week. He reports that he does not use drugs.. Social History   Social History Narrative   Not on file    Objective   Physical Examination BP 128/66 (BP Position: Sitting, BP Cuff Size: Large)   Pulse 80   Ht 174 cm (5' 8.5)   Wt 84.2 kg (185 lb 9.6 oz)   BMI 27.81 kg/m  Gen: Pleasant, healthy appearing, sitting up comfortably, conversing easily in no acute distress Psych: Mood and affect are appropriate.  Laboratory Results Results for orders placed or performed in visit on 05/21/23  Lipid Panel  Result Value Ref Range   Triglycerides 163 (H) 0 - 150 mg/dL   Cholesterol, Total 878 <=200 mg/dL   Cholesterol, HDL 30 (L) 40 - 60 mg/dL   Cholesterol, LDL, Calculated 58 40 - 99 mg/dL   VLDL Cholesterol Cal 32.6 12 - 42 mg/dL   Chol/HDL Ratio 4.0 1.0 - 4.5   Cholesterol, Non-HDL, Calculated 91 70 - 130 mg/dL   FASTING No   PSA (Prostate Specific Antigen)  Result Value Ref Range   PSA 1.02 0.00 - 4.00 ng/mL  Hemoglobin A1c  Result Value Ref Range   Hemoglobin A1C 5.3 4.8 - 5.6 %   Estimated Average Glucose 105 mg/dL  Urinalysis with Microscopy  Result Value Ref Range   Color, UA Light Yellow    Clarity, UA Clear    Specific Gravity, UA 1.010 1.003 - 1.030   pH, UA 5.5 5.0 - 9.0   Leukocyte Esterase, UA Negative Negative   Nitrite, UA Negative Negative   Protein, UA Negative Negative   Glucose, UA Negative Negative   Ketones, UA Negative Negative   Urobilinogen, UA <2.0 mg/dL <7.9 mg/dL   Bilirubin, UA Negative Negative   Blood, UA Negative Negative    RBC, UA <1 <=3 /HPF   WBC, UA 1 <=2 /HPF   Squam Epithel, UA <1 0 - 5 /HPF   Bacteria, UA None Seen None Seen /HPF   *Note: Due to a large number of results and/or encounters for the requested time period, some results have not been displayed. A complete set of results can be found in Results Review.     Assessment and Plan   Assessment & Plan Bilateral hand pain, resolved (probable inflammatory arthritis) Bilateral hand pain resolved after a course of methylprednisolone. Initial diagnosis by orthopedic specialist suggested arthritis, but no x-rays were taken. Symptoms have improved significantly with treatment.  Chronic left hand bruising Persistent  left hand bruising, present for over a year. Patient is very concerned (in the setting of an anxiety history) about possibility of liver dysfunction.  - Ordered liver function tests to assess for potential liver issues. - Ordered complete blood count to evaluate platelet levels and understand bruising.  Tobacco use disorder Long-standing tobacco use disorder with current smoking of one pack per day. Significant smoking history raises concern for lung cancer risk. - Ordered low-dose CT scan for lung cancer screening at [facility] in [place].  Major depressive disorder, recurrent Recurrent major depressive disorder with current treatment of amitriptyline 75 mg nightly. Reports difficulty sleeping and lack of efficacy of current dose. - Increased amitriptyline to 100 mg nightly. - Prescribed a 90-day supply of amitriptyline 100 mg from [facility] in [facility].  Hypertension Blood pressure readings are stable. - Continue current antihypertensive medication regimen.  General health maintenance     Diagnoses and all orders for this visit:  Bruising -     Comprehensive Metabolic Panel; Future -     CBC w/ Differential; Future  Tobacco use -     CT Lung Cancer Screening Baseline or Annual Low Dose; Future  Personal history of  nicotine dependence -     CT Lung Cancer Screening Baseline or Annual Low Dose; Future  Episode of recurrent major depressive disorder, unspecified depression episode severity -     amitriptyline (ELAVIL) 100 MG tablet; Take 1 tablet (100 mg total) by mouth nightly.        Health Maintenance  In terms of health maintenance he is due for  Health Maintenance  Topic Date Due   Medicare Annual Wellness Visit (AWV)  Never done   Lung Cancer Screening Shared Decision Making  Never done   AAA Screening  09/19/2015   Zoster Vaccines (2 of 2) 03/19/2018   COVID-19 Vaccine (4 - 2025-26 season) 10/15/2023   Influenza Vaccine (1) 10/15/2023   Serum Creatinine Monitoring  01/24/2025   Potassium Monitoring  01/24/2025   DTaP/Tdap/Td Vaccines (2 - Td or Tdap) 04/01/2029   Colon Cancer Screening  06/25/2033   Pneumococcal Vaccine 50+  Completed   Hepatitis C Screen  Completed     Debby Pereyra, MD, MPH       [1] Allergies Allergen Reactions   Bee Sting Kit Anaphylaxis   Duloxetine Other (See Comments)    Makes pt feel crazy, causes black outs  Other reaction(s): Other (See Comments)  Makes pt feel crazy, causes black outs  Makes pt feel crazy, causes black outs  Makes pt feel crazy, causes black outs  Makes pt feel crazy, causes black outs   Venom-Honey Bee Anaphylaxis   Amitriptyline Other (See Comments)    Blurred vision, body temperature increased  I get really hot 20 minutes after taking it, water pockets under eyes.  Other reaction(s): Other (See Comments), Other (See Comments)  Blurred vision, body temperature increased  I get really hot 20 minutes after taking it, water pockets under eyes.  Blurred vision, body temperature increased  I get really hot 20 minutes after taking it, water pockets under eyes.  Blurred vision, body temperature increased  I get really hot 20 minutes after taking it, water pockets under eyes.  Blurred vision,  body temperature increased  I get really hot 20 minutes after taking it, water pockets under eyes.  [2] Past Surgical History: Procedure Laterality Date   JOINT REPLACEMENT Left    knee   NASAL  SEPTUM SURGERY     PR COLSC FLX W/REMOVAL LESION BY HOT BX FORCEPS  09/05/2012   Procedure: COLONOSCOPY, FLEXIBLE, PROXIMAL TO SPLENIC FLEXURE; W/REMOVAL TUMOR/POLYP/OTHER LESION, HOT BX FORCEP/CAUTE;  Surgeon: Donna LITTIE Aland, MD;  Location: GI PROCEDURES [facility];  Service: Gastroenterology   PR COLSC FLX W/RMVL OF TUMOR POLYP LESION SNARE TQ Left 09/05/2012   Procedure: COLONOSCOPY FLEX; W/REMOV TUMOR/LES BY SNARE;  Surgeon: Donna LITTIE Aland, MD;  Location: [facility];  Service: Gastroenterology   PR UPPER GI ENDOSCOPY,BIOPSY N/A 09/05/2012   Procedure: UGI ENDOSCOPY; WITH BIOPSY, SINGLE OR MULTIPLE;  Surgeon: Donna LITTIE Aland, MD;  Location: [facility] PROCEDURES [facility];  Service: Gastroenterology   REPLACEMENT TOTAL KNEE     SKIN BIOPSY    "

## 2024-01-25 NOTE — Progress Notes (Signed)
 "  Patient ID: Jon Flores 73 y.o. male 08-18-1950 There is no height or weight on file to calculate BMI.  Subjective:    01/28/2024 Jon Flores 73 y.o. male returns to [facility] with a PMH significant for depression, hypertension, CKD, arthritis, T6 compression fracture and chronic neck pain who presents for evaluation of neck and back pain . The patient presents to clinic today for a follow up evaluation of the chronic pain.   [name] presents today for follow-up evaluation of chronic pain. Overall, the patient's pain is unchanged since the last visit on 12/03/23. The pain without medication and interventions is a 3 on a scale of 0-10 on the NRS scale. The pain with medication and interventions is a 1 out of 10.  Pain is described as prickling and throbbing. The worst pain is located in bilateral shoulders and bilateral wrists. The pain is better with medication and is worse with too much activity. Chronic pain is managed with Percocet 10/325 mg TID. The patient reports the following side effects from pain medication: trouble concentrating.  The patient believes their pain medication contributes to their overall quality of life and function. He reports being able to be more normal. Sleep is worse since last visit. He continues to smoke 1ppd.  Patient  denies a history of IV drug abuse  Procedures / Interventions:      S/P Cervical epidural injection S/P bilateral C3-6 MBB on 04/17/16 with 75-100% pain relief S/P bilateral C3-C6 MBB 09/21/17 with 80% pain relief  S/P Cervical Epidural Steroid Injection 07/09/2019 with 50-75 % pain relief S/P C7/T1 Interlaminar Epidural Steroid Injection 04/27/20 50-75% relief S/P C7/T1 Interlaminar Epidural Steroid Injection 05/25/2022- greater than 75% relief  Imaging: MRI THORACIC SPINE WITHOUT CONTRAST 12/31/15  TECHNIQUE: Multiplanar, multisequence MR imaging of the thoracic spine was performed. No  intravenous contrast was administered.  COMPARISON:  None.  FINDINGS: Alignment:  Physiologic.  Vertebrae: No fracture, evidence of discitis, or bone lesion.  Cord:  Normal signal and morphology.  Paraspinal and other soft tissues: No paraspinal soft tissue abnormality.  Disc levels:  Disc spaces:  Disc spaces are maintained.  T1-T2: No significant disc protrusion, foraminal stenosis or central canal stenosis.  T2-T3: No significant disc protrusion, foraminal stenosis or central canal stenosis.  T3-T4: No significant disc protrusion, foraminal stenosis or central canal stenosis.  T4-T5: No significant disc protrusion, foraminal stenosis or central canal stenosis.  T5-T6: Small central disc protrusion. No foraminal or central canal stenosis.  T6-T7: Mild broad-based disc bulge. No foraminal or central canal stenosis.  T7-T8: Mild broad-based disc bulge. No foraminal or central canal stenosis.  T8-T9: No significant disc protrusion, foraminal stenosis or central canal stenosis.  T9-T10: No significant disc protrusion, foraminal stenosis or central canal stenosis.  T10-T11: No significant disc protrusion, foraminal stenosis or central canal stenosis.  T11-T12: No significant disc protrusion, foraminal stenosis or central canal stenosis.  IMPRESSION: 1. No acute injury of the thoracic spine. 2. No significant thoracic spine disc protrusion, foraminal stenosis or central canal stenosis.    EPIC medication list: Current Medications[1]  Review of systems: As per interval history. 14-point ROS checklist reviewed, noted positive per HPI. All other negative.   Past Medical History, Past Surgery History, Allergies, Social History, and Family History were reviewed with updates noted as follows: As per HPI.  Objective:    Vital Signs:  Vitals:   01/28/24 1321  BP: 141/69  Patient Position:  Sitting  Pulse: 83  SpO2: 97%    Nursing note and vitals  reviewed  Physical examination: General appearance: Well developed, well nourished male in no apparent distress. Skin: No rashes or lesions noted. HEENT: Normocephalic. Cardiovascular: Well perfused.   Respiratory: Unlabored respirations. Symmetric without exertion.  Abdomen: Soft and non-distended. Neck: No pain to palpation over the cervical paraspinous muscles. No pain with neck flexion, extension, or lateral flexion.  Back: No pain to palpation over the spine or costovertebral angles. Normal range of motion without pain reproduction.  Extremities: No deformities, edema, or skin discoloration. Musculoskeletal: Moves all four extremities. No atrophy or tone abnormalities are noted. Neurological: Grossly intact. Psych: Interpersonal behavior socially appropriate. Mood appropriate to interview with clear thought process. Gait: Unassisted gait is non-antalgic.   Assessment:   1. Pain syndrome, chronic  oxyCODONE -acetaminophen  (PERCOCET) 10-325 mg per tablet   oxyCODONE -acetaminophen  (PERCOCET) 10-325 mg per tablet    2. Spondylosis of cervical region without myelopathy or radiculopathy  oxyCODONE -acetaminophen  (PERCOCET) 10-325 mg per tablet   oxyCODONE -acetaminophen  (PERCOCET) 10-325 mg per tablet    3. Spondylosis of lumbar region without myelopathy or radiculopathy  oxyCODONE -acetaminophen  (PERCOCET) 10-325 mg per tablet   oxyCODONE -acetaminophen  (PERCOCET) 10-325 mg per tablet      Plan:   Continue Percocet 10/325 mg TID. DNF 02/01/24 and 03/02/24. Narcan up to date Continue Voltaren gel as needed. Consider bilateral IA shoulder injections in office. Consider repeating the C7/T1 Interlaminar ESI. Confirmatory UDS on 09/27/23 is positive for oxycodone , noroxycodone and oxymorphone.  Confirmatory UDS on 05/24/23 is positive for oxycodone , noroxycodone and oxymorphone. RTC in 8 weeks, sooner if needed.  [place] PMP Aware database was reviewed and is appropriate.    Having taken  into account the patient's medical, social, and psychiatric history, along with the patient's demographic characteristics, history of compliance and pain diagnoses, I consider this patient to be moderate risk for chronic opiate therapy.  Urine Drug Screening is a widely available and familiar form of testing used in order to make informed choices with regard to clinical decision making in chronic pain management patients. Opioid analgesics must be prescribed with discernment and their appropriate use must be assessed periodically. Urine Drug Screening can help track patient compliance, identify substance abuse and misuse, and prevent potentially dangerous drug-drug interactions. Today, an 8-panel point of care immunoassay and alcohol screen was performed in order to accomplish this and to help determine the best course of action for todays visit. Immunoassay urine drug screening, like all tests, is not without its limitations with regard to specificity and sensitivity. For example, 1) We are unable to detect fentanyl  with our currently available immunoassay technology. 2) We are unable to determine the specific medications present and their metabolites within the drug classes. 3) The Opiate class represents morphine , hydrocodone, and hydromorphone . Differentiation between these cannot be determined. 4) Likewise, the Methadone class represents methadone and tapentadol. 5) The Oxycodone  class represents oxycodone  and oxymorphone. The Benzodiazepine class contains all commonly prescribed in that category. Due to these and other limitations encountered with this form of screening, confirmatory testing may also be deemed to be medically necessary and ordered to assist in making decisions regarding this patient's long-term care.   I counseled the patient on the danger of opioid therapy and that medications should never be taken except as prescribed. We discussed how extreme caution is to be used whenever they are  taking their opioid medications especially as it relates to driving or operating machinery. We discussed that  alcohol use with opioid therapy concomitantly is absolutely prohibited.  Patient was told about the risk of respiratory depression especially with concomitant use of opioids with benzodiazepines and was advised to tell his/her other providers that he/she is receiving opioids from us . Finally, it was stated that misuse of opioids can lead to death.  Patient was seen and evaluated by Eleanor Hoit, NP.  The case was reviewed and care coordinated with Dr. Kapural.  Eleanor Hoit, NP       [1] Current Outpatient Medications  Medication Sig Dispense Refill   amitriptyline HCl (ELAVIL) 25 mg tablet TAKE 3 TABLETS BY MOUTH NIGHTLY  99   amitriptyline HCl (ELAVIL) 25 mg tablet Take 75 mg by mouth.     amlodipine-benazepril (LOTREL) 5-10 MG per capsule TAKE 1 CAPSULE BY MOUTH EVERY DAY     fluticasone propionate (FLONASE) 50 mcg/actuation nasal spray SMARTSIG:Both Nares     naloxone (NARCAN) nasal spray (TAKE HOME PACK) one spray by Intranasal route once as needed for up to 1 dose. 2 each 0   naloxone (NARCAN) nasal spray (TAKE HOME PACK) one spray by Intranasal route once as needed for Opioid Reversal for up to 30 days. 2 each 0   [START ON 03/02/2024] oxyCODONE -acetaminophen  (PERCOCET) 10-325 mg per tablet Take one tablet by mouth 3 (three) times a day as needed for Pain for up to 30 days. 90 tablet 0   [START ON 02/01/2024] oxyCODONE -acetaminophen  (PERCOCET) 10-325 mg per tablet Take one tablet by mouth 3 (three) times a day as needed for Pain for up to 30 days. 90 tablet 0   SODIUM FLUORIDE 5000 PPM 1.1 % PSTE dental paste      sulfamethoxazole-trimethoprim (BACTRIM DS) 800-160 mg per tablet Take 1 tablet by mouth 2 (two) times daily.     ZTLIDO  1.8 % PTCH APPLY 1 PATCH TOPICALLY DAILY. 30 patch 1   No current facility-administered medications for this visit.  "

## 2024-02-08 NOTE — Telephone Encounter (Signed)
 Hi Jocelyn,   His CT was negative for any findings that require further investigation.    The radiologist recommended annual repeat (which is normal and part of the protocol due to his smoking history).    Can you let me know if he has any further questions?  tk

## 2024-02-08 NOTE — Telephone Encounter (Signed)
 Copied from CRM #[id number]. Topic: Access To Clinicians - Clinical Question/Test Result >> Feb 08, 2024  1:30 PM Hargis HERO wrote: Test Results: The patient has requested to be contacted regarding their the results for CT  Patient completed appt on 02/04/24  . Preferred Contact: Cell Phone Telephone Information: Mobile          [phone]  Routine callback turnaround time: 24-48 business hours. Programmer, Systems Notified)
